# Patient Record
Sex: Male | Born: 1940 | Race: Black or African American | Hispanic: No | Marital: Married | Smoking: Current every day smoker
Health system: Southern US, Community
[De-identification: ages and names within clinical notes are randomized; demographics above are authoritative.]

## PROBLEM LIST (undated history)

## (undated) DIAGNOSIS — I10 Essential (primary) hypertension: Secondary | ICD-10-CM

## (undated) DIAGNOSIS — G8929 Other chronic pain: Secondary | ICD-10-CM

## (undated) DIAGNOSIS — M549 Dorsalgia, unspecified: Secondary | ICD-10-CM

## (undated) HISTORY — PX: SHOULDER SURGERY: SHX246

## (undated) HISTORY — PX: OTHER SURGICAL HISTORY: SHX169

## (undated) HISTORY — PX: HERNIA REPAIR: SHX51

## (undated) HISTORY — PX: REPLACEMENT TOTAL KNEE: SUR1224

---

## 2015-11-22 ENCOUNTER — Emergency Department (HOSPITAL_BASED_OUTPATIENT_CLINIC_OR_DEPARTMENT_OTHER)
Admission: EM | Admit: 2015-11-22 | Discharge: 2015-11-22 | Disposition: A | Discharge status: Home / Self Care | Payer: Medicare Other | Attending: Emergency Medicine | Admitting: Emergency Medicine

## 2015-11-22 ENCOUNTER — Encounter (HOSPITAL_BASED_OUTPATIENT_CLINIC_OR_DEPARTMENT_OTHER): Payer: Self-pay | Admitting: *Deleted

## 2015-11-22 ENCOUNTER — Emergency Department (HOSPITAL_BASED_OUTPATIENT_CLINIC_OR_DEPARTMENT_OTHER): Payer: Medicare Other

## 2015-11-22 DIAGNOSIS — F17210 Nicotine dependence, cigarettes, uncomplicated: Secondary | ICD-10-CM | POA: Diagnosis not present

## 2015-11-22 DIAGNOSIS — I10 Essential (primary) hypertension: Secondary | ICD-10-CM | POA: Diagnosis not present

## 2015-11-22 DIAGNOSIS — M79662 Pain in left lower leg: Secondary | ICD-10-CM | POA: Insufficient documentation

## 2015-11-22 DIAGNOSIS — M79605 Pain in left leg: Secondary | ICD-10-CM

## 2015-11-22 DIAGNOSIS — G8929 Other chronic pain: Secondary | ICD-10-CM | POA: Diagnosis not present

## 2015-11-22 HISTORY — DX: Dorsalgia, unspecified: M54.9

## 2015-11-22 HISTORY — DX: Other chronic pain: G89.29

## 2015-11-22 MED ORDER — ORPHENADRINE CITRATE ER 100 MG PO TB12: 100.0000 mg | ORAL_TABLET | Freq: Two times a day (BID) | ORAL | Status: AC

## 2015-11-22 MED ORDER — NAPROXEN 500 MG PO TABS: 500.0000 mg | ORAL_TABLET | Freq: Two times a day (BID) | ORAL | Status: DC

## 2015-11-22 NOTE — ED Provider Notes (Signed)
CSN: [id number]     Arrival date & time 11/22/15  1716 History  By signing my name below, I, Freida Busmaniana Omoyeni, attest that this documentation has been prepared under the direction and in the presence of Arby BarretteMarcy Bradden Tadros, MD . Electronically Signed: Freida Busmaniana Omoyeni, Scribe. 11/22/2015. 7:10 PM.    Chief Complaint  Patient presents with  . Leg Pain   The history is provided by the patient. No language interpreter was used.   HPI Comments:  Shaun Rangel is a 75 y.o. male who presents to the Emergency Department complaining of painful knot on his left lower leg  X ~ 2 days. He rates his pain a 6/10.  Pt notes ~50 years ago a "vein busted" in his LLE states his pain today is located near the site. Pt is unsure of acute swelling in the extremity. He reports it looks normal to him. Pt denies recent injury/fall. He also denies fever, chills, CP, SOB. No alleviating factors noted.  Past Medical History  Diagnosis Date  . Chronic back pain    Past Surgical History  Procedure Laterality Date  . Replacement total knee    . Hernia repair    . Penile stent     No family history on file. Social History  Substance Use Topics  . Smoking status: Current Every Day Smoker    Types: Cigarettes  . Smokeless tobacco: None  . Alcohol Use: No    Review of Systems  10 systems reviewed and all are negative for acute change except as noted in the HPI.   Allergies  Review of patient's allergies indicates no known allergies.  Home Medications   Prior to Admission medications   Medication Sig Start Date End Date Taking? Authorizing Provider  orphenadrine (NORFLEX) 100 MG tablet Take 1 tablet (100 mg total) by mouth 2 (two) times daily. 11/22/15   Arby BarretteMarcy Amanie Mcculley, MD   BP 188/81 mmHg  Pulse 58  Temp(Src) 97.9 F (36.6 C) (Oral)  Resp 18  Ht 6\' 4"  (1.93 m)  Wt 170 lb (77.111 kg)  BMI 20.70 kg/m2  SpO2 100% Physical Exam  ED Course  Procedures   DIAGNOSTIC STUDIES:  Oxygen Saturation is 100% on  RA, normal by my interpretation.    COORDINATION OF CARE:  7:10 PM Discussed treatment plan with pt at bedside and pt agreed to plan.  Labs Review Labs Reviewed - No data to display  Imaging Review [place] Venous Img Lower Unilateral Left  11/22/2015  CLINICAL DATA:  Painful knot in the left calf for 2 days. History of tobacco use. EXAM: Left LOWER EXTREMITY VENOUS DOPPLER ULTRASOUND TECHNIQUE: Gray-scale sonography with graded compression, as well as color Doppler and duplex ultrasound were performed to evaluate the lower extremity deep venous systems from the level of the common femoral vein and including the common femoral, femoral, profunda femoral, popliteal and calf veins including the posterior tibial, peroneal and gastrocnemius veins when visible. The superficial great saphenous vein was also interrogated. Spectral Doppler was utilized to evaluate flow at rest and with distal augmentation maneuvers in the common femoral, femoral and popliteal veins. COMPARISON:  None. FINDINGS: Contralateral Common Femoral Vein: Respiratory phasicity is normal and symmetric with the symptomatic side. No evidence of thrombus. Normal compressibility. Common Femoral Vein: No evidence of thrombus. Normal compressibility, respiratory phasicity and response to augmentation. Saphenofemoral Junction: No evidence of thrombus. Normal compressibility and flow on color Doppler imaging. Profunda Femoral Vein: No evidence of thrombus. Normal compressibility and flow on color Doppler  imaging. Femoral Vein: No evidence of thrombus. Normal compressibility, respiratory phasicity and response to augmentation. Popliteal Vein: No evidence of thrombus. Normal compressibility, respiratory phasicity and response to augmentation. Calf Veins: No evidence of thrombus. Normal compressibility and flow on color Doppler imaging. Superficial Great Saphenous Vein: No evidence of thrombus. Normal compressibility and flow on color Doppler imaging.  Venous Reflux:  None. Other Findings:  None. IMPRESSION: No evidence of deep venous thrombosis. Electronically Signed   By: Burman Nieves M.D.   On: 11/22/2015 20:22   I have personally reviewed and evaluated these images and lab results as part of my medical decision-making.   EKG Interpretation None      MDM   Final diagnoses:  Calf pain, left  Musculoskeletal pain of left lower extremity  Essential hypertension   Patient presents for chief complaint of a painful area in his calf. He has no associated knee pain. No associated chest pain, shortness of breath, fever or general malaise. to palpation there is some local tenderness of the medial gastrocnemius. Venous ultrasound shows no presence of DVT. 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 Incidentally noted the patient has hypertension. He reports he has not been seeking regular medical care. He reports aside from some problems with intermittent back pain and knee pain he feels well and does not go to the doctor. His wife does note however at one visit, his blood pressure was quite elevated. It sounds as though he has never had a ongoing primary care physician and has never been treated in the past for hypertension. They have moved to the area within the past year and did not have established primary care at this time. His wife reports they are working on that and will schedule follow-up next week to establish care for hypertension and other needed health screening and maintenance. Resources are discussed.   Arby Barrette, MD 11/22/15 [phone]

## 2015-11-22 NOTE — ED Notes (Signed)
Dr Donnald GarrePfeiffer at bedside now.

## 2015-11-22 NOTE — ED Notes (Signed)
Pain and bulging vein in his left lower leg x 2 days. States he busted a blood vessel in his leg 50 years ago while lifting weights.

## 2015-11-22 NOTE — Discharge Instructions (Signed)
Musculoskeletal Pain °Musculoskeletal pain is muscle and boney aches and pains. These pains can occur in any part of the body. Your caregiver may treat you without knowing the cause of the pain. They may treat you if blood or urine tests, X-rays, and other tests were normal.  °CAUSES °There is often not a definite cause or reason for these pains. These pains may be caused by a type of germ (virus). The discomfort may also come from overuse. Overuse includes working out too hard when your body is not fit. Boney aches also come from weather changes. Bone is sensitive to atmospheric pressure changes. °HOME CARE INSTRUCTIONS  °· Ask when your test results will be ready. Make sure you get your test results. °· Only take over-the-counter or prescription medicines for pain, discomfort, or fever as directed by your caregiver. If you were given medications for your condition, do not drive, operate machinery or power tools, or sign legal documents for 24 hours. Do not drink alcohol. Do not take sleeping pills or other medications that may interfere with treatment. °· Continue all activities unless the activities cause more pain. When the pain lessens, slowly resume normal activities. Gradually increase the intensity and duration of the activities or exercise. °· During periods of severe pain, bed rest may be helpful. Lay or sit in any position that is comfortable. °· Putting ice on the injured area. °· Put ice in a bag. °· Place a towel between your skin and the bag. °· Leave the ice on for 15 to 20 minutes, 3 to 4 times a day. °· Follow up with your caregiver for continued problems and no reason can be found for the pain. If the pain becomes worse or does not go away, it may be necessary to repeat tests or do additional testing. Your caregiver may need to look further for a possible cause. °SEEK IMMEDIATE MEDICAL CARE IF: °· You have pain that is getting worse and is not relieved by medications. °· You develop chest pain  that is associated with shortness or breath, sweating, feeling sick to your stomach (nauseous), or throw up (vomit). °· Your pain becomes localized to the abdomen. °· You develop any new symptoms that seem different or that concern you. °MAKE SURE YOU:  °· Understand these instructions. °· Will watch your condition. °· Will get help right away if you are not doing well or get worse. °  °This information is not intended to replace advice given to you by your health care provider. Make sure you discuss any questions you have with your health care provider. °  °Document Released: 08/18/2005 Document Revised: 11/10/2011 Document Reviewed: 04/22/2013 °Elsevier Interactive Patient Education ©2016 [employer] °Hypertension °Hypertension, commonly called high blood pressure, is when the force of blood pumping through your arteries is too strong. Your arteries are the blood vessels that carry blood from your heart throughout your body. A blood pressure reading consists of a higher number over a lower number, such as 110/72. The higher number (systolic) is the pressure inside your arteries when your heart pumps. The lower number (diastolic) is the pressure inside your arteries when your heart relaxes. Ideally you want your blood pressure below 120/80. °Hypertension forces your heart to work harder to pump blood. Your arteries may become narrow or stiff. Having untreated or uncontrolled hypertension can cause heart attack, stroke, kidney disease, and other problems. °RISK FACTORS °Some risk factors for high blood pressure are controllable. Others are not.  °Risk factors you cannot   control include:  °· Race. You may be at higher risk if you are African American. °· Age. Risk increases with age. °· Gender. Men are at higher risk than women before age 45 years. After age 65, women are at higher risk than men. °Risk factors you can control include: °· Not getting enough exercise or physical activity. °· Being  overweight. °· Getting too much fat, sugar, calories, or salt in your diet. °· Drinking too much alcohol. °SIGNS AND SYMPTOMS °Hypertension does not usually cause signs or symptoms. Extremely high blood pressure (hypertensive crisis) may cause headache, anxiety, shortness of breath, and nosebleed. °DIAGNOSIS °To check if you have hypertension, your health care provider will measure your blood pressure while you are seated, with your arm held at the level of your heart. It should be measured at least twice using the same arm. Certain conditions can cause a difference in blood pressure between your right and left arms. A blood pressure reading that is higher than normal on one occasion does not mean that you need treatment. If it is not clear whether you have high blood pressure, you may be asked to return on a different day to have your blood pressure checked again. Or, you may be asked to monitor your blood pressure at home for 1 or more weeks. °TREATMENT °Treating high blood pressure includes making lifestyle changes and possibly taking medicine. Living a healthy lifestyle can help lower high blood pressure. You may need to change some of your habits. °Lifestyle changes may include: °· Following the DASH diet. This diet is high in fruits, vegetables, and whole grains. It is low in salt, red meat, and added sugars. °· Keep your sodium intake below 2,300 mg per day. °· Getting at least 30-45 minutes of aerobic exercise at least 4 times per week. °· Losing weight if necessary. °· Not smoking. °· Limiting alcoholic beverages. °· Learning ways to reduce stress. °Your health care provider may prescribe medicine if lifestyle changes are not enough to get your blood pressure under control, and if one of the following is true: °· You are 18-59 years of age and your systolic blood pressure is above 140. °· You are 60 years of age or older, and your systolic blood pressure is above 150. °· Your diastolic blood pressure is  above 90. °· You have diabetes, and your systolic blood pressure is over 140 or your diastolic blood pressure is over 90. °· You have kidney disease and your blood pressure is above 140/90. °· You have heart disease and your blood pressure is above 140/90. °Your personal target blood pressure may vary depending on your medical conditions, your age, and other factors. °HOME CARE INSTRUCTIONS °· Have your blood pressure rechecked as directed by your health care provider.   °· Take medicines only as directed by your health care provider. Follow the directions carefully. Blood pressure medicines must be taken as prescribed. The medicine does not work as well when you skip doses. Skipping doses also puts you at risk for problems. °· Do not smoke.   °· Monitor your blood pressure at home as directed by your health care provider.  °SEEK MEDICAL CARE IF:  °· You think you are having a reaction to medicines taken. °· You have recurrent headaches or feel dizzy. °· You have swelling in your ankles. °· You have trouble with your vision. °SEEK IMMEDIATE MEDICAL CARE IF: °· You develop a severe headache or confusion. °· You have unusual weakness, numbness, or feel faint. °·   You have severe chest or abdominal pain.  You vomit repeatedly.  You have trouble breathing. MAKE SURE YOU:   Understand these instructions.  Will watch your condition.  Will get help right away if you are not doing well or get worse.   This information is not intended to replace advice given to you by your health care provider. Make sure you discuss any questions you have with your health care provider.   Document Released: 08/18/2005 Document Revised: 01/02/2015 Document Reviewed: 06/10/2013 Elsevier Interactive Patient Education 2016 Elsevier Inc. DASH Eating Plan DASH stands for "Dietary Approaches to Stop Hypertension." The DASH eating plan is a healthy eating plan that has been shown to reduce high blood pressure (hypertension).  Additional health benefits may include reducing the risk of type 2 diabetes mellitus, heart disease, and stroke. The DASH eating plan may also help with weight loss. WHAT DO I NEED TO KNOW ABOUT THE DASH EATING PLAN? For the DASH eating plan, you will follow these general guidelines:  Choose foods with a percent daily value for sodium of less than 5% (as listed on the food label).  Use salt-free seasonings or herbs instead of table salt or sea salt.  Check with your health care provider or pharmacist before using salt substitutes.  Eat lower-sodium products, often labeled as "lower sodium" or "no salt added."  Eat fresh foods.  Eat more vegetables, fruits, and low-fat dairy products.  Choose whole grains. Look for the word "whole" as the first word in the ingredient list.  Choose fish and skinless chicken or [place] more often than red meat. Limit fish, poultry, and meat to 6 oz (170 g) each day.  Limit sweets, desserts, sugars, and sugary drinks.  Choose heart-healthy fats.  Limit cheese to 1 oz (28 g) per day.  Eat more home-cooked food and less restaurant, buffet, and fast food.  Limit fried foods.  Cook foods using methods other than frying.  Limit canned vegetables. If you do use them, rinse them well to decrease the sodium.  When eating at a restaurant, ask that your food be prepared with less salt, or no salt if possible. WHAT FOODS CAN I EAT? Seek help from a dietitian for individual calorie needs. Grains Whole grain or whole wheat bread. Brown rice. Whole grain or whole wheat pasta. Quinoa, bulgur, and whole grain cereals. Low-sodium cereals. Corn or whole wheat flour tortillas. Whole grain cornbread. Whole grain crackers. Low-sodium crackers. Vegetables Fresh or frozen vegetables (raw, steamed, roasted, or grilled). Low-sodium or reduced-sodium tomato and vegetable juices. Low-sodium or reduced-sodium tomato sauce and paste. Low-sodium or reduced-sodium canned  vegetables.  Fruits All fresh, canned (in natural juice), or frozen fruits. Meat and Other Protein Products Ground beef (85% or leaner), grass-fed beef, or beef trimmed of fat. Skinless chicken or [place]. Ground chicken or [place]. Pork trimmed of fat. All fish and seafood. Eggs. Dried beans, peas, or lentils. Unsalted nuts and seeds. Unsalted canned beans. Dairy Low-fat dairy products, such as skim or 1% milk, 2% or reduced-fat cheeses, low-fat ricotta or cottage cheese, or plain low-fat yogurt. Low-sodium or reduced-sodium cheeses. Fats and Oils Tub margarines without trans fats. Light or reduced-fat mayonnaise and salad dressings (reduced sodium). Avocado. Safflower, olive, or canola oils. Natural peanut or almond butter. Other Unsalted popcorn and pretzels. The items listed above may not be a complete list of recommended foods or beverages. Contact your dietitian for more options. WHAT FOODS ARE NOT RECOMMENDED? Grains White bread. White pasta. White rice.  Refined cornbread. Bagels and croissants. Crackers that contain trans fat. Vegetables Creamed or fried vegetables. Vegetables in a cheese sauce. Regular canned vegetables. Regular canned tomato sauce and paste. Regular tomato and vegetable juices. Fruits Dried fruits. Canned fruit in light or heavy syrup. Fruit juice. Meat and Other Protein Products Fatty cuts of meat. Ribs, chicken wings, bacon, sausage, bologna, salami, chitterlings, fatback, hot dogs, bratwurst, and packaged luncheon meats. Salted nuts and seeds. Canned beans with salt. Dairy Whole or 2% milk, cream, half-and-half, and cream cheese. Whole-fat or sweetened yogurt. Full-fat cheeses or blue cheese. Nondairy creamers and whipped toppings. Processed cheese, cheese spreads, or cheese curds. Condiments Onion and garlic salt, seasoned salt, table salt, and sea salt. Canned and packaged gravies. Worcestershire sauce. Tartar sauce. Barbecue sauce. Teriyaki sauce. Soy sauce,  including reduced sodium. Steak sauce. Fish sauce. Oyster sauce. Cocktail sauce. Horseradish. Ketchup and mustard. Meat flavorings and tenderizers. Bouillon cubes. Hot sauce. Tabasco sauce. Marinades. Taco seasonings. Relishes. Fats and Oils Butter, stick margarine, lard, shortening, ghee, and bacon fat. Coconut, palm kernel, or palm oils. Regular salad dressings. Other Pickles and olives. Salted popcorn and pretzels. The items listed above may not be a complete list of foods and beverages to avoid. Contact your dietitian for more information. WHERE CAN I FIND MORE INFORMATION? National Heart, Lung, and Blood Institute: [employer]   This information is not intended to replace advice given to you by your health care provider. Make sure you discuss any questions you have with your health care provider.   Document Released: 08/07/2011 Document Revised: 09/08/2014 Document Reviewed: 06/22/2013 Elsevier Interactive Patient Education [employer]

## 2015-11-22 NOTE — ED Notes (Signed)
Patient transported to Ultrasound 

## 2016-06-05 DIAGNOSIS — Z79899 Other long term (current) drug therapy: Secondary | ICD-10-CM | POA: Insufficient documentation

## 2016-06-05 DIAGNOSIS — R1013 Epigastric pain: Secondary | ICD-10-CM | POA: Diagnosis present

## 2016-06-05 DIAGNOSIS — F17210 Nicotine dependence, cigarettes, uncomplicated: Secondary | ICD-10-CM | POA: Diagnosis not present

## 2016-06-05 DIAGNOSIS — Z79891 Long term (current) use of opiate analgesic: Secondary | ICD-10-CM | POA: Diagnosis not present

## 2016-06-06 ENCOUNTER — Emergency Department (HOSPITAL_BASED_OUTPATIENT_CLINIC_OR_DEPARTMENT_OTHER): Payer: Medicare Other

## 2016-06-06 ENCOUNTER — Emergency Department (HOSPITAL_BASED_OUTPATIENT_CLINIC_OR_DEPARTMENT_OTHER)
Admission: EM | Admit: 2016-06-06 | Discharge: 2016-06-06 | Disposition: A | Discharge status: Home / Self Care | Payer: Medicare Other | Attending: Emergency Medicine | Admitting: Emergency Medicine

## 2016-06-06 DIAGNOSIS — R1013 Epigastric pain: Secondary | ICD-10-CM

## 2016-06-06 LAB — COMPREHENSIVE METABOLIC PANEL
ALT: 15 U/L — ABNORMAL LOW (ref 17–63)
ANION GAP: 6 (ref 5–15)
AST: 21 U/L (ref 15–41)
Albumin: 3.6 g/dL (ref 3.5–5.0)
Alkaline Phosphatase: 114 U/L (ref 38–126)
BUN: 17 mg/dL (ref 6–20)
CHLORIDE: 102 mmol/L (ref 101–111)
CO2: 27 mmol/L (ref 22–32)
Calcium: 9.5 mg/dL (ref 8.9–10.3)
Creatinine, Ser: 0.85 mg/dL (ref 0.61–1.24)
GFR calc non Af Amer: >60 mL/min (ref >60)
Glucose, Bld: 97 mg/dL (ref 65–99)
POTASSIUM: 5 mmol/L (ref 3.5–5.1)
SODIUM: 135 mmol/L (ref 135–145)
Total Bilirubin: 0.4 mg/dL (ref 0.3–1.2)
Total Protein: 7.8 g/dL (ref 6.5–8.1)

## 2016-06-06 LAB — CBC WITH DIFFERENTIAL/PLATELET
Basophils Absolute: 0 10*3/uL (ref 0.0–0.1)
Basophils Relative: 0 %
EOS ABS: 0.2 10*3/uL (ref 0.0–0.7)
EOS PCT: 5 %
HCT: 41 % (ref 39.0–52.0)
Hemoglobin: 13.9 g/dL (ref 13.0–17.0)
LYMPHS ABS: 1.2 10*3/uL (ref 0.7–4.0)
Lymphocytes Relative: 28 %
MCH: 31 pg (ref 26.0–34.0)
MCHC: 33.9 g/dL (ref 30.0–36.0)
MCV: 91.5 fL (ref 78.0–100.0)
MONOS PCT: 10 %
Monocytes Absolute: 0.5 10*3/uL (ref 0.1–1.0)
Neutro Abs: 2.5 10*3/uL (ref 1.7–7.7)
Neutrophils Relative %: 57 %
PLATELETS: 204 10*3/uL (ref 150–400)
RBC: 4.48 MIL/uL (ref 4.22–5.81)
RDW: 13.2 % (ref 11.5–15.5)
WBC: 4.5 10*3/uL (ref 4.0–10.5)

## 2016-06-06 LAB — LIPASE, BLOOD: Lipase: 108 U/L — ABNORMAL HIGH (ref 11–51)

## 2016-06-06 MED ORDER — FAMOTIDINE IN NACL 20-0.9 MG/50ML-% IV SOLN
20.0000 mg | Freq: Once | INTRAVENOUS | Status: AC
  Administered 2016-06-06: 20 mg via INTRAVENOUS
  Filled 2016-06-06: qty 50

## 2016-06-06 MED ORDER — IOPAMIDOL (ISOVUE-300) INJECTION 61%
100.0000 mL | Freq: Once | INTRAVENOUS | Status: AC | PRN
  Administered 2016-06-06: 100 mL via INTRAVENOUS

## 2016-06-06 MED ORDER — SUCRALFATE 1 G PO TABS: 1.0000 g | ORAL_TABLET | Freq: Three times a day (TID) | ORAL | 0 refills | Status: DC

## 2016-06-06 MED ORDER — ONDANSETRON HCL 4 MG/2ML IJ SOLN
4.0000 mg | Freq: Once | INTRAMUSCULAR | Status: AC
  Administered 2016-06-06: 4 mg via INTRAVENOUS
  Filled 2016-06-06: qty 2

## 2016-06-06 MED ORDER — OMEPRAZOLE 20 MG PO CPDR: DELAYED_RELEASE_CAPSULE | ORAL | 0 refills | Status: DC

## 2016-06-06 MED ORDER — SODIUM CHLORIDE 0.9 % IV SOLN
Freq: Once | INTRAVENOUS | Status: AC
  Administered 2016-06-06: 01:00:00 via INTRAVENOUS

## 2016-06-06 MED ORDER — FENTANYL CITRATE (PF) 100 MCG/2ML IJ SOLN
100.0000 ug | Freq: Once | INTRAMUSCULAR | Status: AC
  Administered 2016-06-06: 100 ug via INTRAVENOUS
  Filled 2016-06-06: qty 2

## 2016-06-06 MED ORDER — SUCRALFATE 1 G PO TABS
1.0000 g | ORAL_TABLET | Freq: Once | ORAL | Status: AC
  Administered 2016-06-06: 1 g via ORAL
  Filled 2016-06-06: qty 1

## 2016-06-06 MED ORDER — OXYCODONE-ACETAMINOPHEN 10-325 MG PO TABS: 1.0000 | ORAL_TABLET | ORAL | 0 refills | Status: AC | PRN

## 2016-06-06 NOTE — ED Notes (Signed)
Pt to CT, no changes, alert, NAD, calm, family remains in room.

## 2016-06-06 NOTE — ED Notes (Signed)
IV attempt x2  Unsuccessful.  

## 2016-06-06 NOTE — ED Provider Notes (Signed)
[facility] Provider Note: Lowella DellJ. Lane Tatum Corl, MD, FACEP  CSN: [id number] MRN: [id number] ARRIVAL: 06/05/16 at 2347   CHIEF COMPLAINT  Abdominal Pain   HISTORY OF PRESENT ILLNESS  Shaun Rangel is a 75 y.o. male with a history of peptic ulcer disease. He is here with epigastric pain for the past 2 weeks. He describes the pain as severe, sharp and burning. It is worse with any attempt to eat and he has lost weight because he is not able to eat. He denies vomiting or diarrhea. He describes the pain as like previous ulcer pain. He has been taking goat milk for it with partial relief.   Past Medical History:  Diagnosis Date  . Chronic back pain     Past Surgical History:  Procedure Laterality Date  . HERNIA REPAIR    . penile stent    . REPLACEMENT TOTAL KNEE      No family history on file.  Social History  Substance Use Topics  . Smoking status: Current Every Day Smoker    Types: Cigarettes  . Smokeless tobacco: Not on file  . Alcohol use No    Prior to Admission medications   Medication Sig Start Date End Date Taking? Authorizing Provider  gabapentin (NEURONTIN) 300 MG capsule Take 300 mg by mouth 3 (three) times daily.   Yes Historical Provider, MD  omeprazole (PRILOSEC) 20 MG capsule Take one capsule daily at least 30 minutes before first dose of Carafate. 06/06/16   Abdulwahab Demelo, MD  orphenadrine (NORFLEX) 100 MG tablet Take 1 tablet (100 mg total) by mouth 2 (two) times daily. 11/22/15   Arby BarretteMarcy Pfeiffer, MD  oxyCODONE-acetaminophen (PERCOCET) 10-325 MG tablet Take 1 tablet by mouth every 4 (four) hours as needed for pain. 06/06/16   Tedra Coppernoll, MD  sucralfate (CARAFATE) 1 g tablet Take 1 tablet (1 g total) by mouth 4 (four) times daily -  with meals and at bedtime. 06/06/16   Paula LibraJohn Lue [place], MD    Allergies Review of patient's allergies indicates no known allergies.   REVIEW OF SYSTEMS  Negative except as noted here or in the History of Present  Illness.   PHYSICAL EXAMINATION  Initial Vital Signs Blood pressure 181/94, pulse 64, temperature 98.2 F (36.8 C), temperature source Oral, resp. rate 17, height 6' 3.5" (1.918 m), weight 160 lb (72.6 kg), SpO2 98 %.  Examination General: Well-developed, well-nourished male in no acute distress; appearance consistent with age of record HENT: normocephalic; atraumatic Eyes: pupils equal, round and reactive to light; extraocular muscles intact; Arcus senilis bilaterally Neck: supple Heart: regular rate and rhythm Lungs: clear to auscultation bilaterally Abdomen: soft; nondistended; epigastric tenderness; no masses or hepatosplenomegaly; bowel sounds present Extremities: No deformity; full range of motion; pulses normal Neurologic: Awake, alert and oriented; motor function intact in all extremities and symmetric; no facial droop Skin: Warm and dry Psychiatric: Normal mood and affect   RESULTS  Summary of this visit's results, reviewed by myself:   EKG Interpretation  Date/Time:    Ventricular Rate:    PR Interval:    QRS Duration:   QT Interval:    QTC Calculation:   R Axis:     Text Interpretation:        Laboratory Studies: Results for orders placed or performed during the hospital encounter of 06/06/16 (from the past 24 hour(s))  CBC with Differential/Platelet     Status: None   Collection Time: 06/06/16  1:10 AM  Result Value Ref Range  WBC 4.5 4.0 - 10.5 K/uL   RBC 4.48 4.22 - 5.81 MIL/uL   Hemoglobin 13.9 13.0 - 17.0 g/dL   HCT 16.1 09.6 - 04.5 %   MCV 91.5 78.0 - 100.0 fL   MCH 31.0 26.0 - 34.0 pg   MCHC 33.9 30.0 - 36.0 g/dL   RDW 40.9 81.1 - 91.4 %   Platelets 204 150 - 400 K/uL   Neutrophils Relative % 57 %   Neutro Abs 2.5 1.7 - 7.7 K/uL   Lymphocytes Relative 28 %   Lymphs Abs 1.2 0.7 - 4.0 K/uL   Monocytes Relative 10 %   Monocytes Absolute 0.5 0.1 - 1.0 K/uL   Eosinophils Relative 5 %   Eosinophils Absolute 0.2 0.0 - 0.7 K/uL   Basophils  Relative 0 %   Basophils Absolute 0.0 0.0 - 0.1 K/uL  Comprehensive metabolic panel     Status: Abnormal   Collection Time: 06/06/16  1:10 AM  Result Value Ref Range   Sodium 135 135 - 145 mmol/L   Potassium 5.0 3.5 - 5.1 mmol/L   Chloride 102 101 - 111 mmol/L   CO2 27 22 - 32 mmol/L   Glucose, Bld 97 65 - 99 mg/dL   BUN 17 6 - 20 mg/dL   Creatinine, Ser 7.82 0.61 - 1.24 mg/dL   Calcium 9.5 8.9 - 95.6 mg/dL   Total Protein 7.8 6.5 - 8.1 g/dL   Albumin 3.6 3.5 - 5.0 g/dL   AST 21 15 - 41 U/L   ALT 15 (L) 17 - 63 U/L   Alkaline Phosphatase 114 38 - 126 U/L   Total Bilirubin 0.4 0.3 - 1.2 mg/dL   GFR calc non Af Amer >60 >60 mL/min   GFR calc Af Amer >60 >60 mL/min   Anion gap 6 5 - 15  Lipase, blood     Status: Abnormal   Collection Time: 06/06/16  1:10 AM  Result Value Ref Range   Lipase 108 (H) 11 - 51 U/L   Imaging Studies: Ct Abdomen Pelvis W Contrast  Result Date: 06/06/2016 CLINICAL DATA:  Epigastric pain for 2 weeks with weight loss and loss of appetite EXAM: CT ABDOMEN AND PELVIS WITH CONTRAST TECHNIQUE: Multidetector CT imaging of the abdomen and pelvis was performed using the standard protocol following bolus administration of intravenous contrast. CONTRAST:  ISOVUE-300 IOPAMIDOL (ISOVUE-300) INJECTION 61% COMPARISON:  None. FINDINGS: Lower chest: Mild fibrosis within the posterior lung base with linear scarring in the left lung base. No acute infiltrate or effusion. Heart size is nonenlarged. Mild coronary calcifications. Hepatobiliary: Focal hypodensity near falciform ligament could relate to focal fatty infiltration. Liver otherwise unremarkable. No gallstones. No intra or extrahepatic biliary dilatation. Pancreas: Unremarkable. No pancreatic ductal dilatation or surrounding inflammatory changes. Spleen: Normal in size without focal abnormality. Adrenals/Urinary Tract: Bilateral adrenal glands are normal. Well-circumscribed hypodense lesions within both kidneys, most of  which are too small to further characterize. There is a cyst in the mid to upper pole of right kidney measuring 1.2 cm. Urinary bladder is unremarkable. Stomach/Bowel: Stomach is filled with contrast and debris. Large amount of stool in the colon. No dilated small bowel to suggest bowel obstruction. 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 No significantly enlarged retroperitoneal nodes. Reproductive: Mild prostatic gland calcification. Other: Metallic surgical mesh along the lower anterior abdominal and pelvis. No free air or free fluid. Musculoskeletal: No acute osseous abnormality. Left supra-acetabular cyst. IMPRESSION: 1. 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 2. No acute intra-abdominal or pelvic pathology. 3. Atherosclerotic vascular disease of the aorta. Electronically Signed   By: Jasmine Pang M.D.   On: 06/06/2016 03:53    ED COURSE  Nursing notes and initial vitals signs, including pulse oximetry, reviewed.  Vitals:   06/06/16 0200 06/06/16 0201 06/06/16 0230 06/06/16 0300  BP: 176/90 179/90 172/92 157/97  Pulse:  107 (!) 54 (!) 58  Resp:  18 18   Temp:      TempSrc:      SpO2:  100% 99% 98%  Weight:      Height:       4:01 AM Patient advised of CT and lab findings. He insists his pain is like that of previous peptic ulcer disease. He was advised that his lipase is elevated and he could have mild pancreatitis. He has a follow-up appointment pending on the 10th of this month. We will start him on medications in the meantime.  PROCEDURES    ED DIAGNOSES     ICD-9-CM ICD-10-CM   1. Epigastric pain 789.06 R10.13        Paula Libra, MD 06/06/16 0405

## 2016-06-06 NOTE — ED Notes (Addendum)
Back from CT, no changes. Alert, calm, NAD.

## 2017-06-17 IMAGING — CT CT ABD-PELV W/ CM
2 of 5 series · 15 of 46 positions shown, 17 images · IV contrast (iopamidol)
Comparison: None.

CLINICAL DATA: Epigastric pain for 2 weeks with weight loss and
loss of appetite

EXAM:
CT ABDOMEN AND PELVIS WITH CONTRAST
TECHNIQUE: Multidetector CT imaging of the abdomen and pelvis was performed
using the standard protocol following bolus administration of
intravenous contrast.
CONTRAST:  100mL 7EVQP6-6WW IOPAMIDOL (7EVQP6-6WW) INJECTION 61%

[Series 2: axial st · axial · 0.75mm/px · z∈[-498,-72]mm · 12 of 97 slices shown, 14 images]
[im 6/97  soft-tissue]
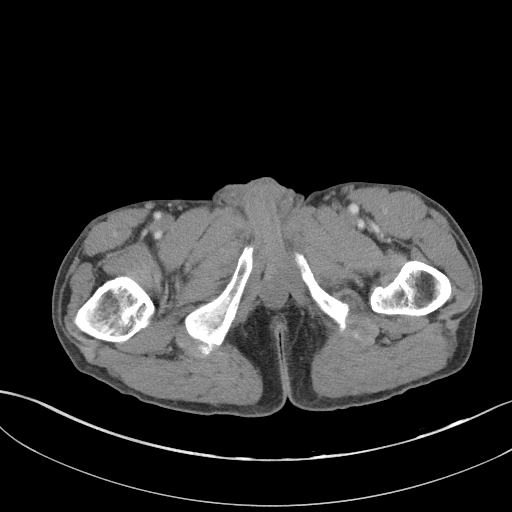
[im 6/97  bone]
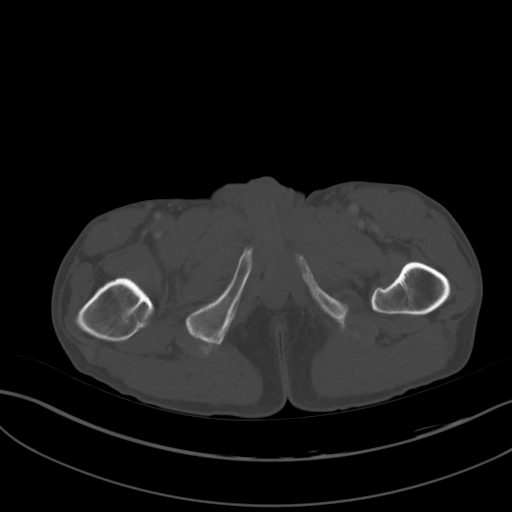
[im 16/97  soft-tissue]
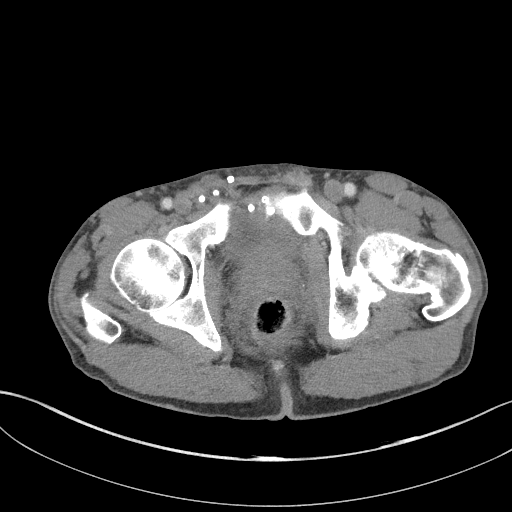
[im 21/97  soft-tissue]
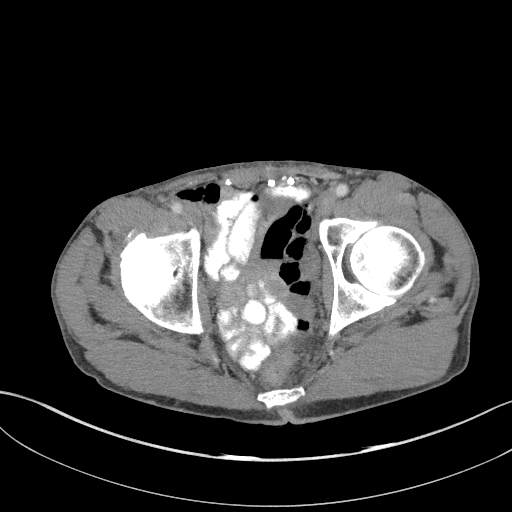
[im 31/97  soft-tissue]
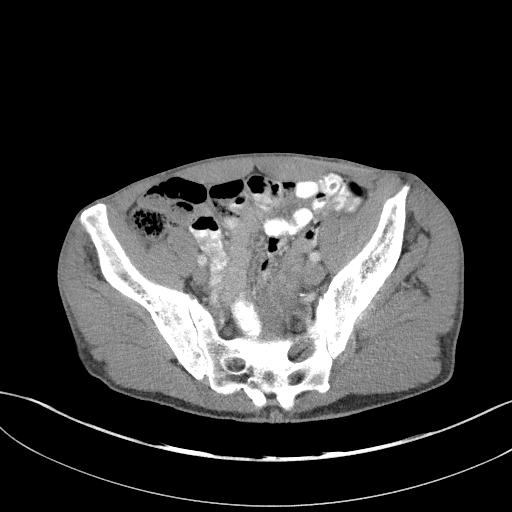
[im 36/97  soft-tissue]
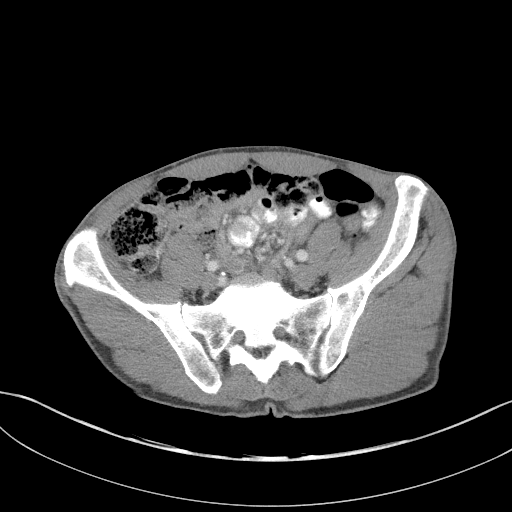
[im 46/97  soft-tissue]
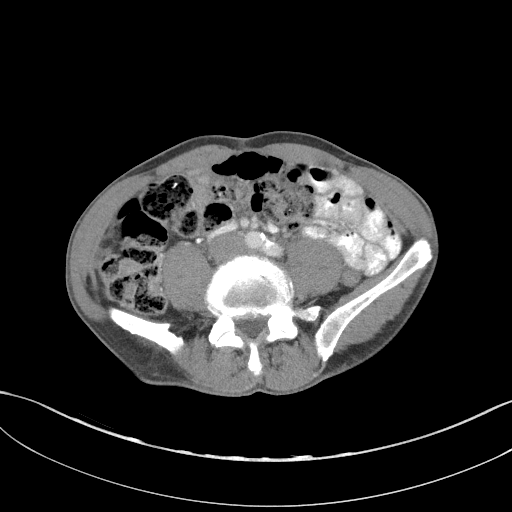
[im 51/97  soft-tissue]
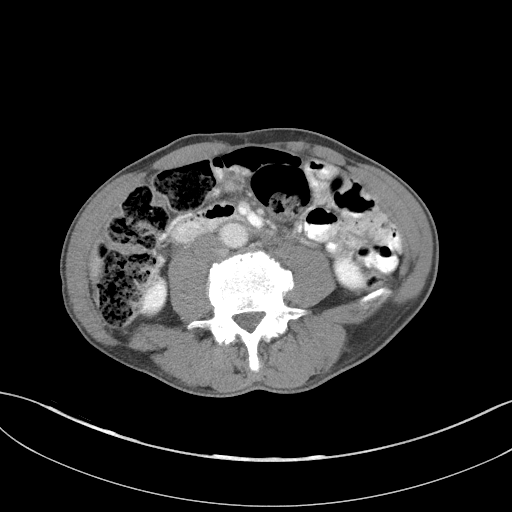
[im 61/97  soft-tissue]
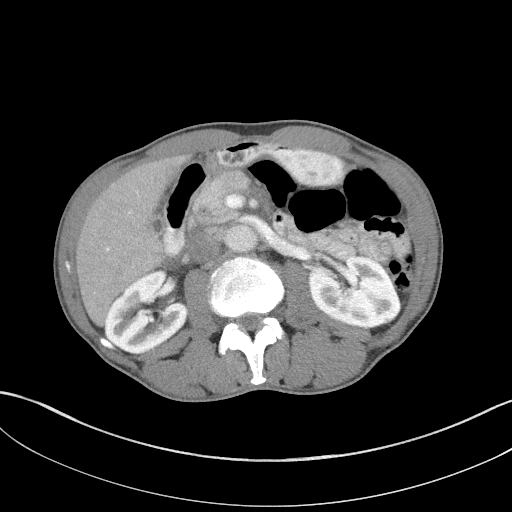
[im 66/97  soft-tissue]
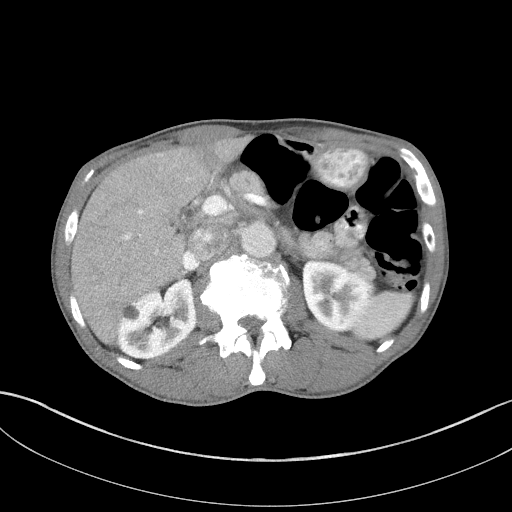
[im 66/97  bone]
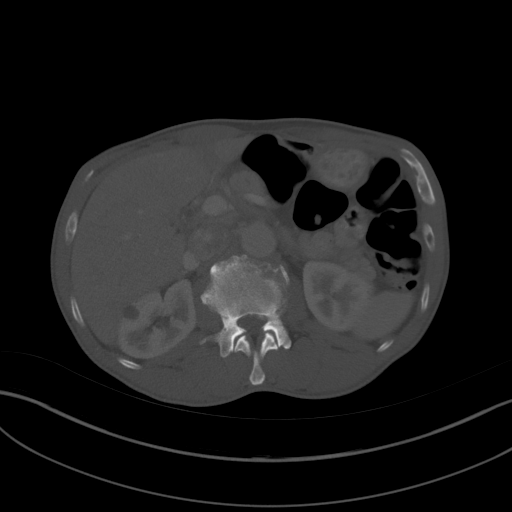
[im 76/97  soft-tissue]
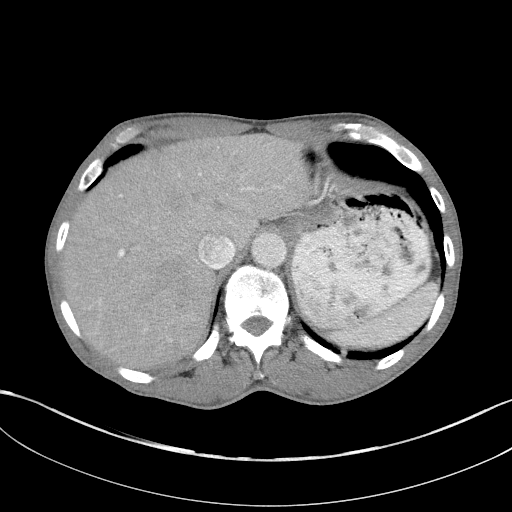
[im 81/97  soft-tissue]
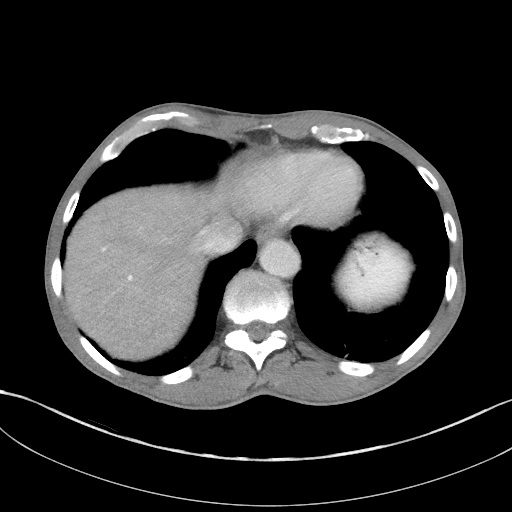
[im 91/97  soft-tissue]
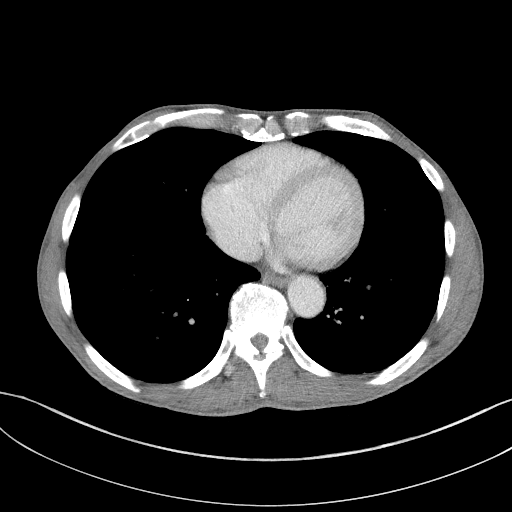

[Series 5: coronal st · coronal · 0.67mm/px · 3 of 73 slices shown]
[im 25/73  soft-tissue]
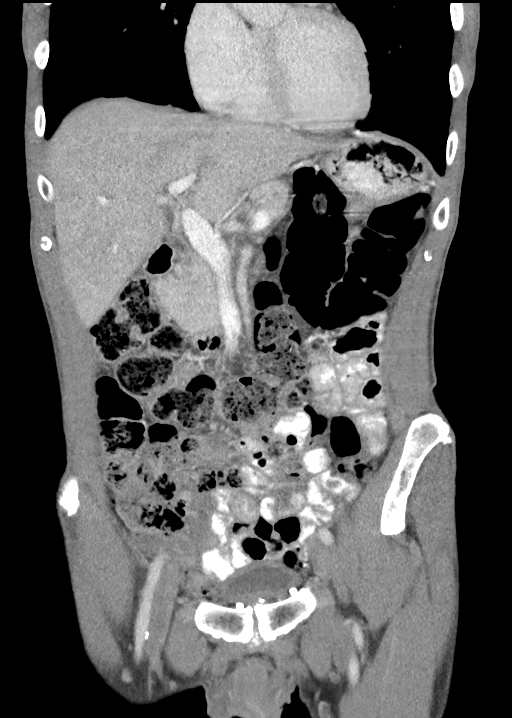
[im 33/73  soft-tissue]
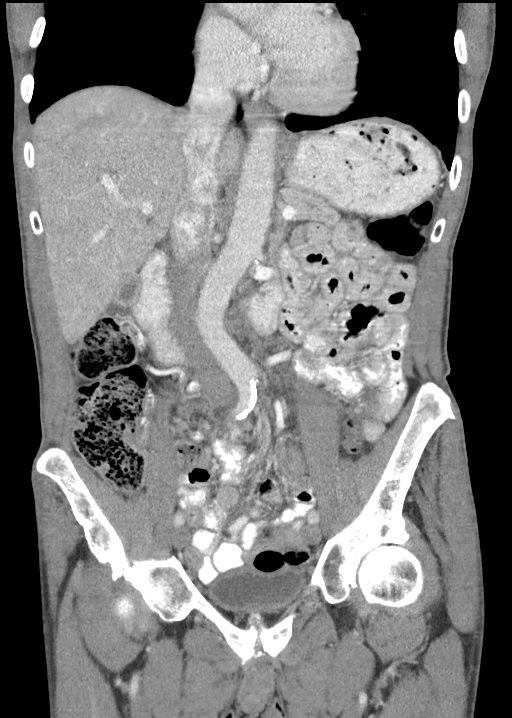
[im 41/73  soft-tissue]
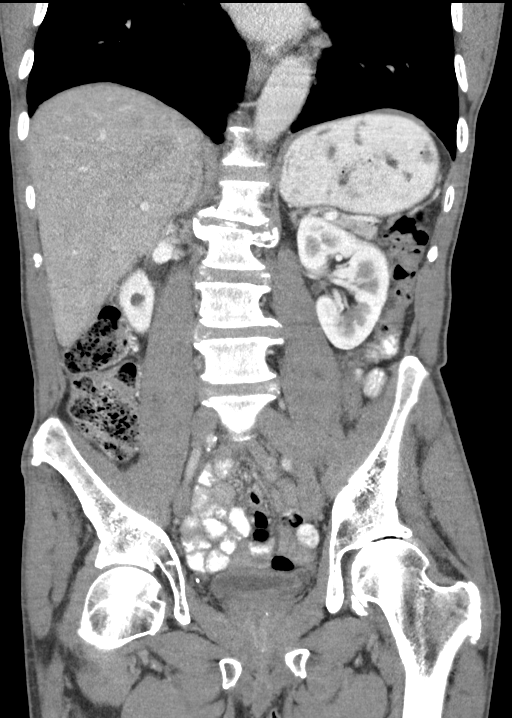

[15 of 46 positions shown; findings below may reference images not displayed]

FINDINGS: Lower chest: Mild fibrosis within the posterior lung base with
linear scarring in the left lung base. No acute infiltrate or
effusion. Heart size is nonenlarged. Mild coronary calcifications.

Hepatobiliary: Focal hypodensity near falciform ligament could
relate to focal fatty infiltration. Liver otherwise unremarkable. No
gallstones. No intra or extrahepatic biliary dilatation.

Pancreas: Unremarkable. No pancreatic ductal dilatation or
surrounding inflammatory changes.

Spleen: Normal in size without focal abnormality.

Adrenals/Urinary Tract: Bilateral adrenal glands are normal.
Well-circumscribed hypodense lesions within both kidneys, most of
which are too small to further characterize. There is a cyst in the
mid to upper pole of right kidney measuring 1.2 cm. Urinary bladder
is unremarkable.

Stomach/Bowel: Stomach is filled with contrast and debris. Large
amount of stool in the colon. No dilated small bowel to suggest
bowel obstruction.

Vascular/Lymphatic: Atherosclerotic vascular disease of the non
aneurysmal aorta. No significantly enlarged retroperitoneal nodes.

Reproductive: Mild prostatic gland calcification.

Other: Metallic surgical mesh along the lower anterior abdominal and
pelvis. No free air or free fluid.

Musculoskeletal: No acute osseous abnormality. Left supra-acetabular
cyst.
IMPRESSION: 1. Mild fibrosis within the bilateral lung bases.
2. No acute intra-abdominal or pelvic pathology.
3. Atherosclerotic vascular disease of the aorta.

## 2021-04-01 ENCOUNTER — Encounter (HOSPITAL_BASED_OUTPATIENT_CLINIC_OR_DEPARTMENT_OTHER): Payer: Self-pay

## 2021-04-01 ENCOUNTER — Other Ambulatory Visit: Payer: Self-pay

## 2021-04-01 ENCOUNTER — Other Ambulatory Visit (HOSPITAL_BASED_OUTPATIENT_CLINIC_OR_DEPARTMENT_OTHER): Payer: Self-pay

## 2021-04-01 ENCOUNTER — Emergency Department (HOSPITAL_BASED_OUTPATIENT_CLINIC_OR_DEPARTMENT_OTHER)
Admission: EM | Admit: 2021-04-01 | Discharge: 2021-04-01 | Disposition: A | Discharge status: Home / Self Care | Payer: Medicare Other | Attending: Emergency Medicine | Admitting: Emergency Medicine

## 2021-04-01 DIAGNOSIS — Z96659 Presence of unspecified artificial knee joint: Secondary | ICD-10-CM | POA: Insufficient documentation

## 2021-04-01 DIAGNOSIS — R1013 Epigastric pain: Secondary | ICD-10-CM | POA: Insufficient documentation

## 2021-04-01 DIAGNOSIS — I10 Essential (primary) hypertension: Secondary | ICD-10-CM | POA: Diagnosis not present

## 2021-04-01 DIAGNOSIS — F17210 Nicotine dependence, cigarettes, uncomplicated: Secondary | ICD-10-CM | POA: Insufficient documentation

## 2021-04-01 HISTORY — DX: Essential (primary) hypertension: I10

## 2021-04-01 LAB — URINALYSIS, ROUTINE W REFLEX MICROSCOPIC
Bilirubin Urine: NEGATIVE
Glucose, UA: NEGATIVE mg/dL
Ketones, ur: NEGATIVE mg/dL
Leukocytes,Ua: NEGATIVE
Nitrite: NEGATIVE
Protein, ur: NEGATIVE mg/dL
Specific Gravity, Urine: <1.005 — ABNORMAL LOW (ref 1.005–1.030)
pH: 6 (ref 5.0–8.0)

## 2021-04-01 LAB — COMPREHENSIVE METABOLIC PANEL
ALT: 21 U/L (ref 0–44)
AST: 29 U/L (ref 15–41)
Albumin: 4.4 g/dL (ref 3.5–5.0)
Alkaline Phosphatase: 92 U/L (ref 38–126)
Anion gap: 8 (ref 5–15)
BUN: 21 mg/dL (ref 8–23)
CO2: 23 mmol/L (ref 22–32)
Calcium: 9.5 mg/dL (ref 8.9–10.3)
Chloride: 100 mmol/L (ref 98–111)
Creatinine, Ser: 1.26 mg/dL — ABNORMAL HIGH (ref 0.61–1.24)
GFR, Estimated: 59 mL/min — ABNORMAL LOW (ref >60)
Glucose, Bld: 104 mg/dL — ABNORMAL HIGH (ref 70–99)
Potassium: 5 mmol/L (ref 3.5–5.1)
Sodium: 131 mmol/L — ABNORMAL LOW (ref 135–145)
Total Bilirubin: 0.6 mg/dL (ref 0.3–1.2)
Total Protein: 8.6 g/dL — ABNORMAL HIGH (ref 6.5–8.1)

## 2021-04-01 LAB — CBC WITH DIFFERENTIAL/PLATELET
Abs Immature Granulocytes: 0.02 10*3/uL (ref 0.00–0.07)
Basophils Absolute: 0.1 10*3/uL (ref 0.0–0.1)
Basophils Relative: 1 %
Eosinophils Absolute: 0.3 10*3/uL (ref 0.0–0.5)
Eosinophils Relative: 6 %
HCT: 43.6 % (ref 39.0–52.0)
Hemoglobin: 15.1 g/dL (ref 13.0–17.0)
Immature Granulocytes: 0 %
Lymphocytes Relative: 36 %
Lymphs Abs: 1.9 10*3/uL (ref 0.7–4.0)
MCH: 33 pg (ref 26.0–34.0)
MCHC: 34.6 g/dL (ref 30.0–36.0)
MCV: 95.4 fL (ref 80.0–100.0)
Monocytes Absolute: 0.4 10*3/uL (ref 0.1–1.0)
Monocytes Relative: 8 %
Neutro Abs: 2.7 10*3/uL (ref 1.7–7.7)
Neutrophils Relative %: 49 %
Platelets: 174 10*3/uL (ref 150–400)
RBC: 4.57 MIL/uL (ref 4.22–5.81)
RDW: 13.2 % (ref 11.5–15.5)
WBC: 5.4 10*3/uL (ref 4.0–10.5)
nRBC: 0 % (ref 0.0–0.2)

## 2021-04-01 LAB — URINALYSIS, MICROSCOPIC (REFLEX): Squamous Epithelial / LPF: NONE SEEN (ref 0–5)

## 2021-04-01 LAB — LIPASE, BLOOD: Lipase: 34 U/L (ref 11–51)

## 2021-04-01 LAB — MAGNESIUM: Magnesium: 1.8 mg/dL (ref 1.7–2.4)

## 2021-04-01 MED ORDER — SODIUM CHLORIDE 0.9 % IV BOLUS
500.0000 mL | Freq: Once | INTRAVENOUS | Status: AC
  Administered 2021-04-01: 500 mL via INTRAVENOUS

## 2021-04-01 MED ORDER — FAMOTIDINE IN NACL 20-0.9 MG/50ML-% IV SOLN
20.0000 mg | Freq: Once | INTRAVENOUS | Status: AC
  Administered 2021-04-01: 20 mg via INTRAVENOUS
  Filled 2021-04-01: qty 50

## 2021-04-01 MED ORDER — SUCRALFATE 1 G PO TABS
1.0000 g | ORAL_TABLET | Freq: Three times a day (TID) | ORAL | 0 refills | Status: AC
  Filled 2021-04-01: qty 40, 10d supply, fill #0

## 2021-04-01 MED ORDER — ALUM & MAG HYDROXIDE-SIMETH 200-200-20 MG/5ML PO SUSP
30.0000 mL | Freq: Once | ORAL | Status: AC
  Administered 2021-04-01: 30 mL via ORAL
  Filled 2021-04-01: qty 30

## 2021-04-01 MED ORDER — OMEPRAZOLE 20 MG PO CPDR
DELAYED_RELEASE_CAPSULE | ORAL | 0 refills | Status: AC
  Filled 2021-04-01: qty 30, 30d supply, fill #0

## 2021-04-01 NOTE — ED Provider Notes (Signed)
[facility] EMERGENCY DEPARTMENT Provider Note   CSN: [id number] Arrival date & time: 04/01/21  1139     History Chief Complaint  Patient presents with   Abdominal Pain    Shaun Rangel is a 80 y.o. male.  Pt presents to the ED today with epigastric abd pain.  The pt said he has had pain for about 3 days.  He has a hx of ulcers and this feels similar.  He does not currently take anything for his stomach.  He still works and came from work because the pain was worsening.  He has not been eating or drinking much.      Past Medical History:  Diagnosis Date   Chronic back pain    Hypertension     There are no problems to display for this patient.   Past Surgical History:  Procedure Laterality Date   HERNIA REPAIR     penile stent     REPLACEMENT TOTAL KNEE     SHOULDER SURGERY         No family history on file.  Social History   Tobacco Use   Smoking status: Every Day    Types: Cigarettes   Smokeless tobacco: Never  Substance Use Topics   Alcohol use: Yes    Comment: occ   Drug use: Yes    Types: Marijuana    Home Medications Prior to Admission medications   Medication Sig Start Date End Date Taking? Authorizing Provider  gabapentin (NEURONTIN) 300 MG capsule Take 300 mg by mouth 3 (three) times daily.    [provider]  omeprazole (PRILOSEC) 20 MG capsule Take one capsule by mouth daily at least 30 minutes before first dose of Carafate. 04/01/21   Jacalyn Lefevre, MD  orphenadrine (NORFLEX) 100 MG tablet Take 1 tablet (100 mg total) by mouth 2 (two) times daily. 11/22/15   Arby Barrette, MD  oxyCODONE-acetaminophen (PERCOCET) 10-325 MG tablet Take 1 tablet by mouth every 4 (four) hours as needed for pain. 06/06/16   Molpus, John, MD  sucralfate (CARAFATE) 1 g tablet Take 1 tablet (1 g total) by mouth 4 (four) times daily -  with meals and at bedtime. 04/01/21   Jacalyn Lefevre, MD    Allergies    Patient has no known allergies.  Review of  Systems   Review of Systems  Gastrointestinal:  Positive for abdominal pain.  All other systems reviewed and are negative.  Physical Exam Updated Vital Signs BP 136/86   Pulse (!) 51   Temp 98.2 F (36.8 C) (Oral)   Resp 20   Ht 6\' 4"  (1.93 m)   Wt 67.1 kg   SpO2 100%   BMI 18.02 kg/m   Physical Exam Vitals and nursing note reviewed.  Constitutional:      Appearance: He is well-developed and underweight.  HENT:     Head: Normocephalic and atraumatic.     Mouth/Throat:     Mouth: Mucous membranes are moist.     Pharynx: Oropharynx is clear.  Eyes:     Extraocular Movements: Extraocular movements intact.     Pupils: Pupils are equal, round, and reactive to light.  Cardiovascular:     Rate and Rhythm: Normal rate and regular rhythm.     Heart sounds: Normal heart sounds.  Pulmonary:     Effort: Pulmonary effort is normal.     Breath sounds: Normal breath sounds.  Abdominal:     General: Abdomen is flat. Bowel sounds are normal.  Palpations: Abdomen is soft.     Tenderness: There is abdominal tenderness in the epigastric area.  Skin:    General: Skin is warm.     Capillary Refill: Capillary refill takes less than 2 seconds.  Neurological:     General: No focal deficit present.     Mental Status: He is alert and oriented to person, place, and time.  Psychiatric:        Mood and Affect: Mood normal.        Behavior: Behavior normal.    ED Results / Procedures / Treatments   Labs (all labs ordered are listed, but only abnormal results are displayed) Labs Reviewed  COMPREHENSIVE METABOLIC PANEL - Abnormal; Notable for the following components:      Result Value   Sodium 131 (*)    Glucose, Bld 104 (*)    Creatinine, Ser 1.26 (*)    Total Protein 8.6 (*)    GFR, Estimated 59 (*)    All other components within normal limits  URINALYSIS, ROUTINE W REFLEX MICROSCOPIC - Abnormal; Notable for the following components:   Specific Gravity, Urine <1.005 (*)    Hgb  urine dipstick TRACE (*)    All other components within normal limits  URINALYSIS, MICROSCOPIC (REFLEX) - Abnormal; Notable for the following components:   Bacteria, UA RARE (*)    All other components within normal limits  CBC WITH DIFFERENTIAL/PLATELET  LIPASE, BLOOD  MAGNESIUM    EKG None  Radiology No results found.  Procedures Procedures   Medications Ordered in ED Medications  famotidine (PEPCID) IVPB 20 mg premix (0 mg Intravenous Stopped 04/01/21 1319)  alum & mag hydroxide-simeth (MAALOX/MYLANTA) 200-200-20 MG/5ML suspension 30 mL (30 mLs Oral Given 04/01/21 1219)  sodium chloride 0.9 % bolus 500 mL (0 mLs Intravenous Stopped 04/01/21 1319)    ED Course  I have reviewed the triage vital signs and the nursing notes.  Pertinent labs & imaging results that were available during my care of the patient were reviewed by me and considered in my medical decision making (see chart for details).    MDM Rules/Calculators/A&P                          Pain is gone after the GI cocktail and pepcid.  Labs with mild aki; likely from not drinking enough.  Pt is given IVFs.  He is told to f/u with GI.  He is to return if worse. He is stable for d/c.  Final Clinical Impression(s) / ED Diagnoses Final diagnoses:  Epigastric pain    Rx / DC Orders ED Discharge Orders          Ordered    omeprazole (PRILOSEC) 20 MG capsule        04/01/21 1306    sucralfate (CARAFATE) 1 g tablet  3 times daily with meals & bedtime        04/01/21 1306             Jacalyn Lefevre, MD 04/01/21 1320

## 2021-04-01 NOTE — ED Triage Notes (Signed)
Pt c/o epigastric pain x 3 days-denies n/v/d-NAD-steady gait

## 2022-11-20 ENCOUNTER — Emergency Department (HOSPITAL_BASED_OUTPATIENT_CLINIC_OR_DEPARTMENT_OTHER): Payer: Medicare Other

## 2022-11-20 ENCOUNTER — Other Ambulatory Visit: Payer: Self-pay

## 2022-11-20 ENCOUNTER — Encounter (HOSPITAL_BASED_OUTPATIENT_CLINIC_OR_DEPARTMENT_OTHER): Payer: Self-pay

## 2022-11-20 ENCOUNTER — Emergency Department (HOSPITAL_BASED_OUTPATIENT_CLINIC_OR_DEPARTMENT_OTHER)
Admission: EM | Admit: 2022-11-20 | Discharge: 2022-11-20 | Disposition: A | Discharge status: Home / Self Care | Payer: Medicare Other | Attending: Emergency Medicine | Admitting: Emergency Medicine

## 2022-11-20 DIAGNOSIS — M25422 Effusion, left elbow: Secondary | ICD-10-CM | POA: Insufficient documentation

## 2022-11-20 DIAGNOSIS — M25522 Pain in left elbow: Secondary | ICD-10-CM | POA: Insufficient documentation

## 2022-11-20 DIAGNOSIS — W010XXA Fall on same level from slipping, tripping and stumbling without subsequent striking against object, initial encounter: Secondary | ICD-10-CM | POA: Diagnosis not present

## 2022-11-20 MED ORDER — HYDROCODONE-ACETAMINOPHEN 5-325 MG PO TABS
1.0000 | ORAL_TABLET | Freq: Once | ORAL | Status: AC
  Administered 2022-11-20: 1 via ORAL
  Filled 2022-11-20: qty 1

## 2022-11-20 NOTE — ED Triage Notes (Signed)
Pt arrives with c/o left elbow injury that happened a few days ago. Per pt, he tripped over a drop cord and fell hitting his elbow.

## 2022-11-20 NOTE — ED Notes (Signed)
Patient transported to X-ray 

## 2022-11-20 NOTE — ED Provider Notes (Signed)
[facility] Provider Note   CSN: [id number] Arrival date & time: 11/20/22  1557     History  Chief Complaint  Patient presents with   Arm Injury    Shaun Rangel is a 82 y.o. male.  Past medical history of hypertension and glaucoma, no history of diabetes, not on blood thinners.  Presents the ER complaining of left elbow pain and swelling.  He reports he tripped and fell about 6 days ago, today he started having pain and swelling, has some decreased range of motion due to pain.  Denies fevers chills or any other symptoms.  No interval trauma.  No redness or warmth no history of gout.   Arm Injury      Home Medications Prior to Admission medications   Medication Sig Start Date End Date Taking? Authorizing Provider  gabapentin (NEURONTIN) 300 MG capsule Take 300 mg by mouth 3 (three) times daily.    [provider]  omeprazole (PRILOSEC) 20 MG capsule Take one capsule by mouth daily at least 30 minutes before first dose of Carafate. 04/01/21   Shaun Pence, MD  orphenadrine (NORFLEX) 100 MG tablet Take 1 tablet (100 mg total) by mouth 2 (two) times daily. 11/22/15   Shaun Shanks, MD  oxyCODONE-acetaminophen (PERCOCET) 10-325 MG tablet Take 1 tablet by mouth every 4 (four) hours as needed for pain. 06/06/16   Molpus, John, MD  sucralfate (CARAFATE) 1 g tablet Take 1 tablet (1 g total) by mouth 4 (four) times daily -  with meals and at bedtime. 04/01/21   Shaun Pence, MD      Allergies    Patient has no known allergies.    Review of Systems   Review of Systems  Physical Exam Updated Vital Signs BP (!) 126/104 (BP Location: Right Arm)   Pulse 67   Temp 98.1 F (36.7 C) (Oral)   Resp 18   Ht 6\' 4"  (1.93 m)   Wt 68 kg   SpO2 100%   BMI 18.26 kg/m  Physical Exam Vitals and nursing note reviewed.  Constitutional:      General: He is not in acute distress.    Appearance: He is well-developed.  HENT:     Head:  Normocephalic and atraumatic.     Mouth/Throat:     Mouth: Mucous membranes are moist.  Eyes:     Conjunctiva/sclera: Conjunctivae normal.  Cardiovascular:     Rate and Rhythm: Normal rate and regular rhythm.     Heart sounds: No murmur heard. Pulmonary:     Effort: Pulmonary effort is normal. No respiratory distress.     Breath sounds: Normal breath sounds.  Abdominal:     Palpations: Abdomen is soft.     Tenderness: There is no abdominal tenderness.  Musculoskeletal:        General: No swelling.     Cervical back: Neck supple.     Comments: Swelling to left elbow with slightly decrease range of motion specifically on flexion.  No fluctuance, no erythema, very minimal warmth to the elbow.  Radial pulse 2+ on the left, sensation intact grossly throughout left upper arm.  No pain in the shoulder or wrist.  Skin:    General: Skin is warm and dry.     Capillary Refill: Capillary refill takes less than 2 seconds.  Neurological:     Mental Status: He is alert.  Psychiatric:        Mood and Affect: Mood normal.  ED Results / Procedures / Treatments   Labs (all labs ordered are listed, but only abnormal results are displayed) Labs Reviewed - No data to display  EKG None  Radiology DG Elbow Complete Left  Result Date: 11/20/2022 CLINICAL DATA:  Decreased range of motion elbow pain EXAM: LEFT ELBOW - COMPLETE 3+ VIEW COMPARISON:  None Available. FINDINGS: No definitive fracture or malalignment. Positive for elbow effusion. Moderate arthritis with prominent spurring. Possible 8 mm calcified loose body at the anterior joint space. IMPRESSION: No acute osseous abnormality. Moderate arthritis with elbow effusion. Possible 8 mm calcified loose body at the anterior joint space. Electronically Signed   By: Shaun Foil M.D.   On: 11/20/2022 16:52    Procedures Procedures    Medications Ordered in ED Medications  HYDROcodone-acetaminophen (NORCO/VICODIN) 5-325 MG per tablet 1 tablet  (1 tablet Oral Given 11/20/22 1705)    ED Course/ Medical Decision Making/ A&P                             Medical Decision Making Diagnosis: Fracture, sprain, gout, septic arthritis Imaging: Patient has effusion and arthritis left elbow, loose body in the joint.  Images viewed and interpreted by me, radiology read is in agreement ED course: Patient fell almost a week ago, starting pain and swelling today.  He had no pain at all prior to this it was less likely occult fracture though cannot rule that out.  Discussed we will put him in a sling and have him follow-up orthopedics..  Shared decision making used, discussed risk and benefits of arthrocentesis to rule out septic arthritis given that he has no systemic symptoms, no redness and has relatively intact range of motion think this is much less likely.  Patient is in agreement with holding off on arthrocentesis or further labs but was given strict return precautions.  Will have him follow-up with orthopedics.  Amount and/or Complexity of Data Reviewed Radiology: ordered.  Risk Prescription drug management.           Final Clinical Impression(s) / ED Diagnoses Final diagnoses:  Elbow pain, left    Rx / DC Orders ED Discharge Orders     None         Gwenevere Abbot, PA-C 11/20/22 1724    Horton, Alvin Critchley, DO 11/29/22 1127

## 2022-11-20 NOTE — Discharge Instructions (Signed)
You are seen today for elbow pain.  You can take over-the-counter medication as needed for pain, you can use ice or heat as needed for pain.  Follow-up with orthopedics and keep your arm in the sling until you follow-up with them.  Come back to the ER if you have fever redness increased pain or other worsening symptoms.
